# Patient Record
Sex: Male | Born: 1960 | Race: White | Hispanic: No | Marital: Married | Smoking: Never smoker
Health system: Southern US, Community
[De-identification: ages and names within clinical notes are randomized; demographics above are authoritative.]

## PROBLEM LIST (undated history)

## (undated) DIAGNOSIS — I10 Essential (primary) hypertension: Secondary | ICD-10-CM

## (undated) DIAGNOSIS — F329 Major depressive disorder, single episode, unspecified: Secondary | ICD-10-CM

## (undated) DIAGNOSIS — F32A Depression, unspecified: Secondary | ICD-10-CM

## (undated) DIAGNOSIS — K7460 Unspecified cirrhosis of liver: Secondary | ICD-10-CM

---

## 1987-08-14 HISTORY — PX: TONSILLECTOMY: SUR1361

## 2013-04-12 ENCOUNTER — Emergency Department (HOSPITAL_COMMUNITY)
Admission: EM | Admit: 2013-04-12 | Discharge: 2013-04-12 | Disposition: A | Discharge status: Home / Self Care | Payer: 59 | Attending: Emergency Medicine | Admitting: Emergency Medicine

## 2013-04-12 ENCOUNTER — Encounter (HOSPITAL_COMMUNITY): Payer: Self-pay | Admitting: *Deleted

## 2013-04-12 ENCOUNTER — Emergency Department (HOSPITAL_COMMUNITY): Payer: 59

## 2013-04-12 DIAGNOSIS — S0081XA Abrasion of other part of head, initial encounter: Secondary | ICD-10-CM

## 2013-04-12 DIAGNOSIS — Z23 Encounter for immunization: Secondary | ICD-10-CM | POA: Insufficient documentation

## 2013-04-12 DIAGNOSIS — R296 Repeated falls: Secondary | ICD-10-CM | POA: Insufficient documentation

## 2013-04-12 DIAGNOSIS — Y9289 Other specified places as the place of occurrence of the external cause: Secondary | ICD-10-CM | POA: Insufficient documentation

## 2013-04-12 DIAGNOSIS — IMO0002 Reserved for concepts with insufficient information to code with codable children: Secondary | ICD-10-CM | POA: Insufficient documentation

## 2013-04-12 DIAGNOSIS — S63259A Unspecified dislocation of unspecified finger, initial encounter: Secondary | ICD-10-CM | POA: Insufficient documentation

## 2013-04-12 DIAGNOSIS — F10929 Alcohol use, unspecified with intoxication, unspecified: Secondary | ICD-10-CM

## 2013-04-12 DIAGNOSIS — Y939 Activity, unspecified: Secondary | ICD-10-CM | POA: Insufficient documentation

## 2013-04-12 DIAGNOSIS — W19XXXA Unspecified fall, initial encounter: Secondary | ICD-10-CM

## 2013-04-12 DIAGNOSIS — I10 Essential (primary) hypertension: Secondary | ICD-10-CM | POA: Insufficient documentation

## 2013-04-12 DIAGNOSIS — F1010 Alcohol abuse, uncomplicated: Secondary | ICD-10-CM | POA: Insufficient documentation

## 2013-04-12 DIAGNOSIS — S62609A Fracture of unspecified phalanx of unspecified finger, initial encounter for closed fracture: Secondary | ICD-10-CM

## 2013-04-12 HISTORY — DX: Essential (primary) hypertension: I10

## 2013-04-12 MED ORDER — TETANUS-DIPHTH-ACELL PERTUSSIS 5-2.5-18.5 LF-MCG/0.5 IM SUSP
0.5000 mL | Freq: Once | INTRAMUSCULAR | Status: AC
  Administered 2013-04-12: 0.5 mL via INTRAMUSCULAR
  Filled 2013-04-12: qty 0.5

## 2013-04-12 NOTE — ED Provider Notes (Signed)
6:27 AM Handoff from [employer]. Patient intoxicated. Imaging of head, face, spine was negative. Can be discharged when sober and able to leave safely.   9:17 AM Patient ambulatory in department. Will discharge.    Renne Crigler, PA-C 04/12/13 [phone]
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
## 2013-04-12 NOTE — ED Provider Notes (Signed)
Medical screening examination/treatment/procedure(s) were performed by non-physician practitioner and as supervising physician I was immediately available for consultation/collaboration.  Adam Castellanos M Shadonna Benedick, MD 04/12/13 0629 

## 2013-04-12 NOTE — ED Notes (Signed)
Pt ambulated to nurses station without difficulty.  

## 2013-04-14 NOTE — ED Provider Notes (Signed)
Medical screening examination/treatment/procedure(s) were performed by non-physician practitioner and as supervising physician I was immediately available for consultation/collaboration.  Shahzaib Azevedo M Narely Nobles, MD 04/14/13 2031 

## 2016-07-28 ENCOUNTER — Encounter (HOSPITAL_COMMUNITY): Payer: Self-pay | Admitting: Emergency Medicine

## 2016-07-28 ENCOUNTER — Emergency Department (HOSPITAL_COMMUNITY): Payer: BLUE CROSS/BLUE SHIELD

## 2016-07-28 ENCOUNTER — Inpatient Hospital Stay (HOSPITAL_COMMUNITY)
Admission: EM | Admit: 2016-07-28 | Discharge: 2016-07-30 | DRG: 896 | Disposition: A | Discharge status: Home / Self Care | Payer: BLUE CROSS/BLUE SHIELD | Attending: Pulmonary Disease | Admitting: Pulmonary Disease

## 2016-07-28 DIAGNOSIS — R40243 Glasgow coma scale score 3-8, unspecified time: Secondary | ICD-10-CM

## 2016-07-28 DIAGNOSIS — F10129 Alcohol abuse with intoxication, unspecified: Secondary | ICD-10-CM | POA: Diagnosis not present

## 2016-07-28 DIAGNOSIS — Y92481 Parking lot as the place of occurrence of the external cause: Secondary | ICD-10-CM

## 2016-07-28 DIAGNOSIS — R791 Abnormal coagulation profile: Secondary | ICD-10-CM | POA: Diagnosis present

## 2016-07-28 DIAGNOSIS — W010XXA Fall on same level from slipping, tripping and stumbling without subsequent striking against object, initial encounter: Secondary | ICD-10-CM | POA: Diagnosis present

## 2016-07-28 DIAGNOSIS — G9341 Metabolic encephalopathy: Secondary | ICD-10-CM | POA: Diagnosis present

## 2016-07-28 DIAGNOSIS — J9602 Acute respiratory failure with hypercapnia: Secondary | ICD-10-CM | POA: Diagnosis present

## 2016-07-28 DIAGNOSIS — I10 Essential (primary) hypertension: Secondary | ICD-10-CM | POA: Diagnosis present

## 2016-07-28 DIAGNOSIS — Z22322 Carrier or suspected carrier of Methicillin resistant Staphylococcus aureus: Secondary | ICD-10-CM

## 2016-07-28 DIAGNOSIS — W19XXXA Unspecified fall, initial encounter: Secondary | ICD-10-CM

## 2016-07-28 DIAGNOSIS — Y908 Blood alcohol level of 240 mg/100 ml or more: Secondary | ICD-10-CM

## 2016-07-28 DIAGNOSIS — F10929 Alcohol use, unspecified with intoxication, unspecified: Secondary | ICD-10-CM | POA: Diagnosis present

## 2016-07-28 DIAGNOSIS — E872 Acidosis: Secondary | ICD-10-CM | POA: Diagnosis present

## 2016-07-28 DIAGNOSIS — D696 Thrombocytopenia, unspecified: Secondary | ICD-10-CM | POA: Diagnosis present

## 2016-07-28 DIAGNOSIS — J9690 Respiratory failure, unspecified, unspecified whether with hypoxia or hypercapnia: Secondary | ICD-10-CM

## 2016-07-28 DIAGNOSIS — K7460 Unspecified cirrhosis of liver: Secondary | ICD-10-CM | POA: Diagnosis present

## 2016-07-28 HISTORY — DX: Unspecified cirrhosis of liver: K74.60

## 2016-07-28 HISTORY — DX: Major depressive disorder, single episode, unspecified: F32.9

## 2016-07-28 HISTORY — DX: Depression, unspecified: F32.A

## 2016-07-28 LAB — I-STAT ARTERIAL BLOOD GAS, ED
Acid-base deficit: 6 mmol/L — ABNORMAL HIGH (ref 0.0–2.0)
Bicarbonate: 22 mmol/L (ref 20.0–28.0)
O2 SAT: 96 %
PCO2 ART: 51 mmHg — AB (ref 32.0–48.0)
Patient temperature: 98.6
TCO2: 24 mmol/L (ref 0–100)
pH, Arterial: 7.243 — ABNORMAL LOW (ref 7.350–7.450)
pO2, Arterial: 101 mmHg (ref 83.0–108.0)

## 2016-07-28 LAB — ETHANOL: Alcohol, Ethyl (B): 265 mg/dL — ABNORMAL HIGH (ref <5)

## 2016-07-28 LAB — COMPREHENSIVE METABOLIC PANEL
ALK PHOS: 55 U/L (ref 38–126)
ALT: 62 U/L (ref 17–63)
ANION GAP: 11 (ref 5–15)
AST: 49 U/L — ABNORMAL HIGH (ref 15–41)
Albumin: 4.1 g/dL (ref 3.5–5.0)
BILIRUBIN TOTAL: 0.7 mg/dL (ref 0.3–1.2)
BUN: 10 mg/dL (ref 6–20)
CALCIUM: 9 mg/dL (ref 8.9–10.3)
CO2: 20 mmol/L — ABNORMAL LOW (ref 22–32)
Chloride: 110 mmol/L (ref 101–111)
Creatinine, Ser: 0.94 mg/dL (ref 0.61–1.24)
Glucose, Bld: 87 mg/dL (ref 65–99)
Potassium: 4.1 mmol/L (ref 3.5–5.1)
Sodium: 141 mmol/L (ref 135–145)
TOTAL PROTEIN: 7.3 g/dL (ref 6.5–8.1)

## 2016-07-28 LAB — CBC WITH DIFFERENTIAL/PLATELET
BASOS ABS: 0.1 10*3/uL (ref 0.0–0.1)
Basophils Relative: 1 %
EOS ABS: 0.2 10*3/uL (ref 0.0–0.7)
EOS PCT: 1 %
HCT: 44.3 % (ref 39.0–52.0)
Hemoglobin: 14.9 g/dL (ref 13.0–17.0)
Lymphocytes Relative: 28 %
Lymphs Abs: 3 10*3/uL (ref 0.7–4.0)
MCH: 29.1 pg (ref 26.0–34.0)
MCHC: 33.6 g/dL (ref 30.0–36.0)
MCV: 86.5 fL (ref 78.0–100.0)
Monocytes Absolute: 1.3 10*3/uL — ABNORMAL HIGH (ref 0.1–1.0)
Monocytes Relative: 12 %
Neutro Abs: 6.1 10*3/uL (ref 1.7–7.7)
Neutrophils Relative %: 58 %
PLATELETS: 132 10*3/uL — AB (ref 150–400)
RBC: 5.12 MIL/uL (ref 4.22–5.81)
RDW: 13 % (ref 11.5–15.5)
WBC: 10.6 10*3/uL — AB (ref 4.0–10.5)

## 2016-07-28 LAB — LIPASE, BLOOD: LIPASE: 30 U/L (ref 11–51)

## 2016-07-28 LAB — PROTIME-INR
INR: 1.12
PROTHROMBIN TIME: 14.4 s (ref 11.4–15.2)

## 2016-07-28 LAB — I-STAT CG4 LACTIC ACID, ED: LACTIC ACID, VENOUS: 2.81 mmol/L — AB (ref 0.5–1.9)

## 2016-07-28 MED ORDER — PROPOFOL 1000 MG/100ML IV EMUL
5.0000 ug/kg/min | Freq: Once | INTRAVENOUS | Status: AC
  Administered 2016-07-28: 9.853 ug/kg/min via INTRAVENOUS

## 2016-07-28 MED ORDER — ONDANSETRON HCL 4 MG/2ML IJ SOLN
INTRAMUSCULAR | Status: AC
  Administered 2016-07-28: 21:00:00 4 mg via INTRAVENOUS
  Filled 2016-07-28: qty 2

## 2016-07-28 MED ORDER — SODIUM CHLORIDE 0.9 % IV BOLUS (SEPSIS)
1000.0000 mL | Freq: Once | INTRAVENOUS | Status: AC
  Administered 2016-07-29: 1000 mL via INTRAVENOUS

## 2016-07-28 MED ORDER — NALOXONE HCL 2 MG/2ML IJ SOSY
PREFILLED_SYRINGE | INTRAMUSCULAR | Status: AC
  Filled 2016-07-28: qty 2

## 2016-07-28 MED ORDER — SUCCINYLCHOLINE CHLORIDE 20 MG/ML IJ SOLN
INTRAMUSCULAR | Status: DC | PRN
  Administered 2016-07-28: 100 mg via INTRAVENOUS

## 2016-07-28 MED ORDER — PROPOFOL BOLUS VIA INFUSION
0.5000 mg/kg | Freq: Once | INTRAVENOUS | Status: AC
  Administered 2016-07-28: 49.9 mg via INTRAVENOUS

## 2016-07-28 MED ORDER — NALOXONE HCL 2 MG/2ML IJ SOSY
PREFILLED_SYRINGE | INTRAMUSCULAR | Status: DC | PRN
  Administered 2016-07-28 (×2): 1 mg via INTRAVENOUS

## 2016-07-28 MED ORDER — ETOMIDATE 2 MG/ML IV SOLN: 20.0000 mg | Freq: Once | INTRAVENOUS | Status: DC

## 2016-07-28 MED ORDER — SUCCINYLCHOLINE CHLORIDE 20 MG/ML IJ SOLN
100.0000 mg | Freq: Once | INTRAMUSCULAR | Status: DC
  Filled 2016-07-28: qty 5

## 2016-07-28 MED ORDER — PROPOFOL 1000 MG/100ML IV EMUL
INTRAVENOUS | Status: AC
  Filled 2016-07-28: qty 100

## 2016-07-28 MED ORDER — ETOMIDATE 2 MG/ML IV SOLN
INTRAVENOUS | Status: DC | PRN
  Administered 2016-07-28: 20 mg via INTRAVENOUS

## 2016-07-28 MED ORDER — ONDANSETRON HCL 4 MG/2ML IJ SOLN
4.0000 mg | Freq: Once | INTRAMUSCULAR | Status: AC
  Administered 2016-07-28: 4 mg via INTRAVENOUS

## 2016-07-28 NOTE — ED Notes (Signed)
Physician moved ET tube to 26 cm at the lips per x-Zameer

## 2016-07-28 NOTE — Code Documentation (Addendum)
Difficult intubation
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
## 2016-07-28 NOTE — Progress Notes (Signed)
Pt back from CT no distress or complications noted. 100% FiO2.

## 2016-07-28 NOTE — ED Notes (Signed)
Pt BP dropping, pt getting bolus of NS

## 2016-07-28 NOTE — ED Notes (Signed)
Pt vomited on stretcher, ordered zofran

## 2016-07-28 NOTE — ED Notes (Signed)
Patient transported to CT 
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
## 2016-07-29 ENCOUNTER — Encounter (HOSPITAL_COMMUNITY): Payer: Self-pay | Admitting: Emergency Medicine

## 2016-07-29 DIAGNOSIS — W010XXA Fall on same level from slipping, tripping and stumbling without subsequent striking against object, initial encounter: Secondary | ICD-10-CM | POA: Diagnosis present

## 2016-07-29 DIAGNOSIS — Z22322 Carrier or suspected carrier of Methicillin resistant Staphylococcus aureus: Secondary | ICD-10-CM | POA: Diagnosis not present

## 2016-07-29 DIAGNOSIS — F10929 Alcohol use, unspecified with intoxication, unspecified: Secondary | ICD-10-CM | POA: Diagnosis present

## 2016-07-29 DIAGNOSIS — D696 Thrombocytopenia, unspecified: Secondary | ICD-10-CM | POA: Diagnosis present

## 2016-07-29 DIAGNOSIS — R791 Abnormal coagulation profile: Secondary | ICD-10-CM | POA: Diagnosis present

## 2016-07-29 DIAGNOSIS — I10 Essential (primary) hypertension: Secondary | ICD-10-CM | POA: Diagnosis present

## 2016-07-29 DIAGNOSIS — J9602 Acute respiratory failure with hypercapnia: Secondary | ICD-10-CM | POA: Diagnosis present

## 2016-07-29 DIAGNOSIS — F10129 Alcohol abuse with intoxication, unspecified: Secondary | ICD-10-CM | POA: Diagnosis present

## 2016-07-29 DIAGNOSIS — E872 Acidosis: Secondary | ICD-10-CM | POA: Diagnosis present

## 2016-07-29 DIAGNOSIS — K7460 Unspecified cirrhosis of liver: Secondary | ICD-10-CM | POA: Diagnosis present

## 2016-07-29 DIAGNOSIS — G9341 Metabolic encephalopathy: Secondary | ICD-10-CM | POA: Diagnosis present

## 2016-07-29 DIAGNOSIS — G9340 Encephalopathy, unspecified: Secondary | ICD-10-CM | POA: Diagnosis not present

## 2016-07-29 DIAGNOSIS — Y908 Blood alcohol level of 240 mg/100 ml or more: Secondary | ICD-10-CM | POA: Diagnosis present

## 2016-07-29 DIAGNOSIS — J9601 Acute respiratory failure with hypoxia: Secondary | ICD-10-CM | POA: Diagnosis not present

## 2016-07-29 DIAGNOSIS — Y92481 Parking lot as the place of occurrence of the external cause: Secondary | ICD-10-CM | POA: Diagnosis not present

## 2016-07-29 LAB — RAPID URINE DRUG SCREEN, HOSP PERFORMED
AMPHETAMINES: NOT DETECTED
Barbiturates: NOT DETECTED
Benzodiazepines: NOT DETECTED
COCAINE: NOT DETECTED
OPIATES: NOT DETECTED
TETRAHYDROCANNABINOL: NOT DETECTED

## 2016-07-29 LAB — CBC
HCT: 40.3 % (ref 39.0–52.0)
Hemoglobin: 13.7 g/dL (ref 13.0–17.0)
MCH: 29.1 pg (ref 26.0–34.0)
MCHC: 34 g/dL (ref 30.0–36.0)
MCV: 85.7 fL (ref 78.0–100.0)
PLATELETS: 116 10*3/uL — AB (ref 150–400)
RBC: 4.7 MIL/uL (ref 4.22–5.81)
RDW: 13.3 % (ref 11.5–15.5)
WBC: 7.5 10*3/uL (ref 4.0–10.5)

## 2016-07-29 LAB — URINALYSIS, ROUTINE W REFLEX MICROSCOPIC
BILIRUBIN URINE: NEGATIVE
Glucose, UA: NEGATIVE mg/dL
Hgb urine dipstick: NEGATIVE
Ketones, ur: NEGATIVE mg/dL
Leukocytes, UA: NEGATIVE
NITRITE: NEGATIVE
PROTEIN: NEGATIVE mg/dL
SPECIFIC GRAVITY, URINE: 1.006 (ref 1.005–1.030)
pH: 6 (ref 5.0–8.0)

## 2016-07-29 LAB — BASIC METABOLIC PANEL
Anion gap: 9 (ref 5–15)
BUN: 9 mg/dL (ref 6–20)
CALCIUM: 8.1 mg/dL — AB (ref 8.9–10.3)
CO2: 22 mmol/L (ref 22–32)
Chloride: 111 mmol/L (ref 101–111)
Creatinine, Ser: 0.78 mg/dL (ref 0.61–1.24)
GFR calc Af Amer: >60 mL/min (ref >60)
GLUCOSE: 108 mg/dL — AB (ref 65–99)
POTASSIUM: 4.1 mmol/L (ref 3.5–5.1)
SODIUM: 142 mmol/L (ref 135–145)

## 2016-07-29 LAB — MAGNESIUM: MAGNESIUM: 2 mg/dL (ref 1.7–2.4)

## 2016-07-29 LAB — PHOSPHORUS: Phosphorus: 2.9 mg/dL (ref 2.5–4.6)

## 2016-07-29 LAB — AMMONIA: Ammonia: 51 umol/L — ABNORMAL HIGH (ref 9–35)

## 2016-07-29 LAB — TSH: TSH: 3.333 u[IU]/mL (ref 0.350–4.500)

## 2016-07-29 LAB — MRSA PCR SCREENING: MRSA by PCR: POSITIVE — AB

## 2016-07-29 MED ORDER — PROPOFOL 1000 MG/100ML IV EMUL
INTRAVENOUS | Status: AC
  Administered 2016-07-29: 50 ug/kg/min via INTRAVENOUS
  Filled 2016-07-29: qty 100

## 2016-07-29 MED ORDER — PROPOFOL 1000 MG/100ML IV EMUL
5.0000 ug/kg/min | INTRAVENOUS | Status: DC
  Administered 2016-07-29: 15 ug/kg/min via INTRAVENOUS
  Administered 2016-07-29 (×2): 50 ug/kg/min via INTRAVENOUS
  Filled 2016-07-29 (×2): qty 100

## 2016-07-29 MED ORDER — LISINOPRIL 20 MG PO TABS
40.0000 mg | ORAL_TABLET | Freq: Every day | ORAL | Status: DC
  Administered 2016-07-30: 40 mg via ORAL
  Filled 2016-07-29: qty 2

## 2016-07-29 MED ORDER — THIAMINE HCL 100 MG/ML IJ SOLN
100.0000 mg | Freq: Once | INTRAMUSCULAR | Status: AC
  Administered 2016-07-29: 100 mg via INTRAVENOUS
  Filled 2016-07-29: qty 2

## 2016-07-29 MED ORDER — ORAL CARE MOUTH RINSE
15.0000 mL | Freq: Two times a day (BID) | OROMUCOSAL | Status: DC
  Administered 2016-07-29 – 2016-07-30 (×2): 15 mL via OROMUCOSAL

## 2016-07-29 MED ORDER — DEXTROSE-NACL 5-0.45 % IV SOLN
INTRAVENOUS | Status: DC
  Administered 2016-07-29 – 2016-07-30 (×3): via INTRAVENOUS

## 2016-07-29 MED ORDER — PANTOPRAZOLE SODIUM 40 MG IV SOLR
40.0000 mg | INTRAVENOUS | Status: DC
  Administered 2016-07-29 – 2016-07-30 (×2): 40 mg via INTRAVENOUS
  Filled 2016-07-29 (×2): qty 40

## 2016-07-29 MED ORDER — CHLORHEXIDINE GLUCONATE CLOTH 2 % EX PADS
6.0000 | MEDICATED_PAD | Freq: Every day | CUTANEOUS | Status: DC
  Administered 2016-07-30: 6 via TOPICAL

## 2016-07-29 MED ORDER — ONDANSETRON HCL 4 MG/2ML IJ SOLN
4.0000 mg | Freq: Four times a day (QID) | INTRAMUSCULAR | Status: DC | PRN
  Administered 2016-07-28 – 2016-07-29 (×2): 4 mg via INTRAVENOUS

## 2016-07-29 MED ORDER — ENOXAPARIN SODIUM 40 MG/0.4ML ~~LOC~~ SOLN: 40.0000 mg | SUBCUTANEOUS | Status: DC

## 2016-07-29 MED ORDER — MUPIROCIN 2 % EX OINT
1.0000 "application " | TOPICAL_OINTMENT | Freq: Two times a day (BID) | CUTANEOUS | Status: DC
  Administered 2016-07-29 – 2016-07-30 (×3): 1 via NASAL
  Filled 2016-07-29: qty 22

## 2016-07-29 MED ORDER — METOPROLOL SUCCINATE ER 25 MG PO TB24
100.0000 mg | ORAL_TABLET | Freq: Every day | ORAL | Status: DC
Start: 2016-07-29 — End: 2016-07-30
  Administered 2016-07-30: 100 mg via ORAL
  Filled 2016-07-29: qty 4

## 2016-07-29 MED ORDER — ONDANSETRON HCL 4 MG/2ML IJ SOLN
INTRAMUSCULAR | Status: AC
Start: 2016-07-29 — End: 2016-07-29
  Filled 2016-07-29: qty 2

## 2016-07-29 MED ORDER — SODIUM CHLORIDE 0.9 % IV SOLN: 250.0000 mL | INTRAVENOUS | Status: DC | PRN

## 2016-07-29 MED ORDER — ORAL CARE MOUTH RINSE
15.0000 mL | OROMUCOSAL | Status: DC
  Administered 2016-07-29 (×4): 15 mL via OROMUCOSAL

## 2016-07-29 MED ORDER — CHLORHEXIDINE GLUCONATE 0.12% ORAL RINSE (MEDLINE KIT)
15.0000 mL | Freq: Two times a day (BID) | OROMUCOSAL | Status: DC
  Administered 2016-07-29: 15 mL via OROMUCOSAL

## 2016-07-29 MED ORDER — SERTRALINE HCL 100 MG PO TABS
100.0000 mg | ORAL_TABLET | Freq: Every day | ORAL | Status: DC
  Administered 2016-07-30: 100 mg via ORAL
  Filled 2016-07-29 (×2): qty 1

## 2016-07-29 MED ORDER — PROMETHAZINE HCL 25 MG/ML IJ SOLN
12.5000 mg | Freq: Once | INTRAMUSCULAR | Status: AC
  Administered 2016-07-29: 12.5 mg via INTRAVENOUS
  Filled 2016-07-29: qty 1

## 2016-07-29 NOTE — Progress Notes (Signed)
eLink Physician-Brief Progress Note Patient Name: Adam CritchleyRay Hersh DOB: 07/29/1961 MRN: [id number]   Date of Service  07/29/2016  HPI/Events of Note  Agitation - Propofol IV infusion needs to be reordered.   eICU Interventions  Will reorder Propofol IV infusion. Titrate to RASS = -2 to -3.     Intervention Category Minor Interventions: Agitation / anxiety - evaluation and management  Sommer,Steven Eugene 07/29/2016, 2:06 AM

## 2016-07-29 NOTE — ED Notes (Signed)
Pt wallet and ID given to security, documentation sent to unit
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
## 2016-07-29 NOTE — Progress Notes (Signed)
Pt extubated per MD order. Pt tol well.  No cuff leak present but MD was present and aware. Ordered to extubate. No stridor or resp distress noted. Pt placed on 4 L [place], sats 98%. Will cont to monitor

## 2016-07-29 NOTE — ED Notes (Signed)
Critical care at bedside  

## 2016-07-29 NOTE — Progress Notes (Signed)
Dr Craige CottaSood called to make aware pt very nauseous and vomited since extubation, zofran was given at 0915 prior to extubation. Vomit is small in amount approx. 50cc and brown to green at times clear mucous with blood streaks. MD ordered one time dose of phenergan 12.5mg  IV. Will cont to monitor and assess

## 2016-07-29 NOTE — Progress Notes (Signed)
eLink Physician-Brief Progress Note Patient Name: Macarthur CritchleyRay Bartunek DOB: 05/24/1961 MRN: [id number]   Date of Service  07/29/2016  HPI/Events of Note  Notified of need for stress ulcer prophylaxis.   eICU Interventions  Will order Protonix IV.      Intervention Category Intermediate Interventions: Best-practice therapies (e.g. DVT, beta blocker, etc.)  Lola Czerwonka Eugene 07/29/2016, 4:32 AM

## 2016-07-30 ENCOUNTER — Encounter: Payer: Self-pay | Admitting: Pulmonary Disease

## 2016-07-30 DIAGNOSIS — J9601 Acute respiratory failure with hypoxia: Secondary | ICD-10-CM

## 2016-07-30 DIAGNOSIS — W19XXXA Unspecified fall, initial encounter: Secondary | ICD-10-CM

## 2016-07-30 DIAGNOSIS — G9340 Encephalopathy, unspecified: Secondary | ICD-10-CM

## 2016-07-30 DIAGNOSIS — Y908 Blood alcohol level of 240 mg/100 ml or more: Secondary | ICD-10-CM

## 2016-07-30 DIAGNOSIS — F10929 Alcohol use, unspecified with intoxication, unspecified: Secondary | ICD-10-CM

## 2016-07-30 DIAGNOSIS — R40243 Glasgow coma scale score 3-8, unspecified time: Secondary | ICD-10-CM

## 2016-07-30 LAB — CBC
HEMATOCRIT: 41.2 % (ref 39.0–52.0)
Hemoglobin: 13.9 g/dL (ref 13.0–17.0)
MCH: 29.1 pg (ref 26.0–34.0)
MCHC: 33.7 g/dL (ref 30.0–36.0)
MCV: 86.4 fL (ref 78.0–100.0)
Platelets: 100 10*3/uL — ABNORMAL LOW (ref 150–400)
RBC: 4.77 MIL/uL (ref 4.22–5.81)
RDW: 13.4 % (ref 11.5–15.5)
WBC: 8 10*3/uL (ref 4.0–10.5)

## 2016-07-30 LAB — BASIC METABOLIC PANEL
Anion gap: 6 (ref 5–15)
BUN: 8 mg/dL (ref 6–20)
CHLORIDE: 102 mmol/L (ref 101–111)
CO2: 28 mmol/L (ref 22–32)
CREATININE: 0.9 mg/dL (ref 0.61–1.24)
Calcium: 8.6 mg/dL — ABNORMAL LOW (ref 8.9–10.3)
GFR calc non Af Amer: >60 mL/min (ref >60)
Glucose, Bld: 101 mg/dL — ABNORMAL HIGH (ref 65–99)
POTASSIUM: 4 mmol/L (ref 3.5–5.1)
Sodium: 136 mmol/L (ref 135–145)

## 2016-07-30 LAB — GLUCOSE, CAPILLARY: GLUCOSE-CAPILLARY: 114 mg/dL — AB (ref 65–99)

## 2016-07-30 MED ORDER — PANTOPRAZOLE SODIUM 40 MG PO TBEC: 40.0000 mg | DELAYED_RELEASE_TABLET | Freq: Every day | ORAL | Status: DC

## 2016-07-30 MED ORDER — MUPIROCIN 2 % EX OINT: 1.0000 "application " | TOPICAL_OINTMENT | Freq: Two times a day (BID) | CUTANEOUS | 0 refills | Status: AC

## 2016-07-30 NOTE — Progress Notes (Signed)
CSW provided ETOH rehab resources to patient- no questions or concerns at this time.  Patient reports being motivated to stop and has good support from family  CSW signing off  Burna SisJenna H. Payge Eppes, LCSW Clinical Social Worker [phone]

## 2016-07-30 NOTE — Progress Notes (Signed)
DC IV and tele per orders, DC instructions reviewed with patient and spouse at bedside, note for work given to spouse, no further questions from patient or spouse.  Hermina BartersBOWMAN, Jasminemarie Sherrard M, RN
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
## 2016-07-30 NOTE — Plan of Care (Signed)
Problem: Respiratory: Goal: Ability to maintain a clear airway and adequate ventilation will improve Outcome: Completed/Met Date Met: 07/30/16 Extubated 07/29/2016

## 2020-08-30 ENCOUNTER — Other Ambulatory Visit: Payer: Self-pay

## 2020-08-30 ENCOUNTER — Emergency Department (HOSPITAL_COMMUNITY): Payer: Self-pay

## 2020-08-30 ENCOUNTER — Encounter (HOSPITAL_COMMUNITY): Payer: Self-pay

## 2020-08-30 ENCOUNTER — Emergency Department (HOSPITAL_COMMUNITY)
Admission: EM | Admit: 2020-08-30 | Discharge: 2020-08-31 | Disposition: A | Discharge status: Home / Self Care | Payer: Self-pay | Attending: Emergency Medicine | Admitting: Emergency Medicine

## 2020-08-30 DIAGNOSIS — U071 COVID-19: Secondary | ICD-10-CM | POA: Insufficient documentation

## 2020-08-30 DIAGNOSIS — L509 Urticaria, unspecified: Secondary | ICD-10-CM | POA: Insufficient documentation

## 2020-08-30 DIAGNOSIS — Y9302 Activity, running: Secondary | ICD-10-CM | POA: Insufficient documentation

## 2020-08-30 DIAGNOSIS — Z23 Encounter for immunization: Secondary | ICD-10-CM | POA: Insufficient documentation

## 2020-08-30 DIAGNOSIS — R079 Chest pain, unspecified: Secondary | ICD-10-CM

## 2020-08-30 DIAGNOSIS — S41111A Laceration without foreign body of right upper arm, initial encounter: Secondary | ICD-10-CM | POA: Insufficient documentation

## 2020-08-30 DIAGNOSIS — Z79899 Other long term (current) drug therapy: Secondary | ICD-10-CM | POA: Insufficient documentation

## 2020-08-30 DIAGNOSIS — W000XXA Fall on same level due to ice and snow, initial encounter: Secondary | ICD-10-CM | POA: Insufficient documentation

## 2020-08-30 DIAGNOSIS — S41112A Laceration without foreign body of left upper arm, initial encounter: Secondary | ICD-10-CM | POA: Insufficient documentation

## 2020-08-30 DIAGNOSIS — F1410 Cocaine abuse, uncomplicated: Secondary | ICD-10-CM | POA: Insufficient documentation

## 2020-08-30 DIAGNOSIS — W19XXXA Unspecified fall, initial encounter: Secondary | ICD-10-CM

## 2020-08-30 DIAGNOSIS — I10 Essential (primary) hypertension: Secondary | ICD-10-CM | POA: Insufficient documentation

## 2020-08-30 LAB — COMPREHENSIVE METABOLIC PANEL
ALT: 36 U/L (ref 0–44)
AST: 45 U/L — ABNORMAL HIGH (ref 15–41)
Albumin: 4.3 g/dL (ref 3.5–5.0)
Alkaline Phosphatase: 40 U/L (ref 38–126)
Anion gap: 13 (ref 5–15)
BUN: 20 mg/dL (ref 6–20)
CO2: 23 mmol/L (ref 22–32)
Calcium: 9 mg/dL (ref 8.9–10.3)
Chloride: 99 mmol/L (ref 98–111)
Creatinine, Ser: 1.44 mg/dL — ABNORMAL HIGH (ref 0.61–1.24)
GFR, Estimated: 56 mL/min — ABNORMAL LOW (ref >60)
Glucose, Bld: 78 mg/dL (ref 70–99)
Potassium: 4.3 mmol/L (ref 3.5–5.1)
Sodium: 135 mmol/L (ref 135–145)
Total Bilirubin: 2.1 mg/dL — ABNORMAL HIGH (ref 0.3–1.2)
Total Protein: 7.5 g/dL (ref 6.5–8.1)

## 2020-08-30 LAB — URINALYSIS, ROUTINE W REFLEX MICROSCOPIC
Bacteria, UA: NONE SEEN
Bilirubin Urine: NEGATIVE
Glucose, UA: NEGATIVE mg/dL
Hgb urine dipstick: NEGATIVE
Ketones, ur: 20 mg/dL — AB
Leukocytes,Ua: NEGATIVE
Nitrite: NEGATIVE
Protein, ur: 30 mg/dL — AB
Specific Gravity, Urine: 1.028 (ref 1.005–1.030)
pH: 5 (ref 5.0–8.0)

## 2020-08-30 LAB — CBC WITH DIFFERENTIAL/PLATELET
Abs Immature Granulocytes: 0.06 10*3/uL (ref 0.00–0.07)
Basophils Absolute: 0 10*3/uL (ref 0.0–0.1)
Basophils Relative: 0 %
Eosinophils Absolute: 0.1 10*3/uL (ref 0.0–0.5)
Eosinophils Relative: 2 %
HCT: 48.5 % (ref 39.0–52.0)
Hemoglobin: 16.1 g/dL (ref 13.0–17.0)
Immature Granulocytes: 1 %
Lymphocytes Relative: 16 %
Lymphs Abs: 1 10*3/uL (ref 0.7–4.0)
MCH: 28.9 pg (ref 26.0–34.0)
MCHC: 33.2 g/dL (ref 30.0–36.0)
MCV: 86.9 fL (ref 80.0–100.0)
Monocytes Absolute: 0.6 10*3/uL (ref 0.1–1.0)
Monocytes Relative: 9 %
Neutro Abs: 4.5 10*3/uL (ref 1.7–7.7)
Neutrophils Relative %: 72 %
Platelets: 168 10*3/uL (ref 150–400)
RBC: 5.58 MIL/uL (ref 4.22–5.81)
RDW: 12.6 % (ref 11.5–15.5)
WBC: 6.2 10*3/uL (ref 4.0–10.5)
nRBC: 0 % (ref 0.0–0.2)

## 2020-08-30 LAB — RESP PANEL BY RT-PCR (FLU A&B, COVID) ARPGX2
Influenza A by PCR: NEGATIVE
Influenza B by PCR: NEGATIVE
SARS Coronavirus 2 by RT PCR: POSITIVE — AB

## 2020-08-30 LAB — LIPASE, BLOOD: Lipase: 23 U/L (ref 11–51)

## 2020-08-30 LAB — TROPONIN I (HIGH SENSITIVITY): Troponin I (High Sensitivity): 7 ng/L (ref <18)

## 2020-08-30 MED ORDER — DIPHENHYDRAMINE HCL 25 MG PO CAPS
25.0000 mg | ORAL_CAPSULE | Freq: Once | ORAL | Status: AC
  Administered 2020-08-30: 25 mg via ORAL
  Filled 2020-08-30: qty 1

## 2020-08-30 MED ORDER — TETANUS-DIPHTH-ACELL PERTUSSIS 5-2.5-18.5 LF-MCG/0.5 IM SUSY
0.5000 mL | PREFILLED_SYRINGE | Freq: Once | INTRAMUSCULAR | Status: AC
  Administered 2020-08-30: 0.5 mL via INTRAMUSCULAR
  Filled 2020-08-30: qty 0.5

## 2020-08-30 NOTE — ED Notes (Signed)
Per Social work pt son will bring his meds at 10am 08/31/2020 and pt may then go to covid hotel which social worker will facilitate.

## 2020-08-30 NOTE — ED Notes (Signed)
Spoke with Social work who is working on getting pt into [employer].  In order to go pt will need a 10 day supply of his current medications (per pt this is Paxil, Toprol and Seroquel which he reports that he took last dose last week).  Gave pt phone to answer questions for social work.

## 2020-08-30 NOTE — Progress Notes (Signed)
[facility]W spoke via phone with Pt to gather information to fill out referral for [facility] placement. CSW was able to complete form and was given verbal permission to sign in Pt's stead.  CSW then contacted Pt's son, Jonathan @[phone] in an effort to locate Pt's current medications.   Due to Health Department regulations, Pt's must have medications with them upon arrival at hotel.  This CSW sent a secure email to first shift CSW at [facility] as well as first shift CSW at [facility] with referral form and handoff information.

## 2020-08-30 NOTE — Clinical Social Work Note (Signed)
CSW spoke with pts son Adam Castaneda about pt returning to his home. Pt has been living with his son since July/August. Per [name] pt stated he would try to get clean and go to meetings but has not followed through. [name] cannot let pt return currently. [name] states he spoke to pt who stated he was going to find out information about shelters. Due to pt being COVID + CSW reached out to [facility]W for assistance with attempting to get pt placed at COVID hotel. [name] would like updates when possible about pts discharge plan. TOC to follow.

## 2020-08-30 NOTE — Social Work (Signed)
[facility]CSW attempting to contact Pt in order to discuss d/c needs. Pt not answering room phone. CSW will continue to follow.

## 2020-08-30 NOTE — Discharge Instructions (Addendum)
For the rash you can continue to take Benadryl every 6 hours until resolved, follow-up with your primary care provider.  Use substance abuse and counseling resources provided.   For your COVID infection--Quarantine for at least 5 days and if symptoms resolved may wear mask around others.  Return to the ED if shortness of breath, worsening fatigue or other concerns.

## 2020-08-30 NOTE — ED Notes (Signed)
CRITICAL VALUE ALERT  Critical Value:  Covid +  Date & Time Notied:  08/30/2020  Provider Notified: Dalene Seltzer, EDP  Orders Received/Actions taken: acknowledged

## 2020-08-30 NOTE — ED Notes (Signed)
Pt had taken off his wet pants in the lobby, he was assisted to put on a dry pair of blue scrub pants.
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
## 2020-08-31 NOTE — ED Notes (Signed)
Pt given meal tray.

## 2020-08-31 NOTE — Progress Notes (Signed)
TOC CM/CSW was notified by Chantelle/[facility] [employer], pts room number is [phone] and from 1-2 hours from now pt will be picked up for transport to [facility] .  CSW will continue to follow for dc needs.  Corrinna Karapetyan Tarpley-Carter, MSW, LCSW-A Pronouns:  She, Her, Hers                  [facility] ED Transitions of CareClinical Social Worker [email or web address] [phone]
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
## 2020-08-31 NOTE — Progress Notes (Signed)
TOC CM/CSW was notified by Alvis Lemmings, RN that pts meds have been delivered by pts son/Jonathan.  CSW has contacted Chantelle/[employer].  Chantelle has disclosed that transportation will be here for pick up in 1-2 hours.  Dawn, RN will be notified of this pick up time for [facility] transportation.  CSW will continue to follow for dc needs.  Trampus Mcquerry Tarpley-Carter, MSW, LCSW-A Pronouns:  She, Her, Hers                  [facility] Transitions of CareClinical Social Worker [email or web address] [phone]

## 2020-08-31 NOTE — Progress Notes (Signed)
TOC CM/CSW spoke with pts son/Jonathan [phone].  [name] stated he would arrive here at approx. 10-11am today to pay for dad's medication to take with him to [facility].  CSW will continue to follow for dc needs.  Nagee Goates Tarpley-Carter, MSW, LCSW-A Pronouns:  She, Her, Hers                  [facility] ED Transitions of CareClinical Social Worker [email or web address] [phone]
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
